# Patient Record
Sex: Male | Born: 2000 | Marital: Single
Health system: Southern US, Community
[De-identification: ages and names within clinical notes are randomized; demographics above are authoritative.]

---
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
## 2021-02-05 ENCOUNTER — Emergency Department
Admission: EM | Admit: 2021-02-05 | Discharge: 2021-02-05 | Disposition: A | Discharge status: Home / Self Care | Payer: PRIVATE HEALTH INSURANCE | Attending: Emergency Medicine | Admitting: Emergency Medicine

## 2021-02-05 ENCOUNTER — Other Ambulatory Visit: Payer: Self-pay

## 2021-02-05 ENCOUNTER — Encounter: Payer: Self-pay | Admitting: Emergency Medicine

## 2021-02-05 ENCOUNTER — Emergency Department: Payer: PRIVATE HEALTH INSURANCE

## 2021-02-05 DIAGNOSIS — W260XXA Contact with knife, initial encounter: Secondary | ICD-10-CM | POA: Diagnosis not present

## 2021-02-05 DIAGNOSIS — S6992XA Unspecified injury of left wrist, hand and finger(s), initial encounter: Secondary | ICD-10-CM | POA: Diagnosis present

## 2021-02-05 DIAGNOSIS — S61412A Laceration without foreign body of left hand, initial encounter: Secondary | ICD-10-CM

## 2021-02-05 DIAGNOSIS — S61012A Laceration without foreign body of left thumb without damage to nail, initial encounter: Secondary | ICD-10-CM | POA: Insufficient documentation

## 2021-02-05 DIAGNOSIS — Y990 Civilian activity done for income or pay: Secondary | ICD-10-CM | POA: Diagnosis not present

## 2021-02-05 MED ORDER — LIDOCAINE HCL (PF) 1 % IJ SOLN
5.0000 mL | Freq: Once | INTRAMUSCULAR | Status: AC
  Administered 2021-02-05: 5 mL

## 2021-02-05 MED ORDER — BACITRACIN ZINC 500 UNIT/GM EX OINT: TOPICAL_OINTMENT | Freq: Two times a day (BID) | CUTANEOUS | Status: DC

## 2021-02-05 MED ORDER — LIDOCAINE HCL (PF) 1 % IJ SOLN
INTRAMUSCULAR | Status: AC
  Administered 2021-02-05: 5 mL
  Filled 2021-02-05: qty 5

## 2021-02-05 NOTE — ED Provider Notes (Signed)
[facility] Emergency Department Provider Note   ____________________________________________   I have reviewed the triage vital signs and the nursing notes.   HISTORY  Chief Complaint Laceration   History limited by: Not Limited   HPI Marcus Ramsey is a 20 y.o. male who presents to the emergency department today because of concern for laceration to his left thumb. He states he was at work when it happened. He was cutting something with a knife when it slipped and he accidentily cut his thumb. He denies any numbness to his thumb, denies any decreased rom. States last tetanus shot was roughly 2 years ago.   Records reviewed. No pertinent past medical history.  History reviewed. No pertinent surgical history.  Prior to Admission medications   Not on File    Allergies Patient has no known allergies.  No family history on file.  Social History  Employed Former [employer]  Review of Systems Constitutional: No fever/chills Eyes: No visual changes. ENT: No sore throat. Cardiovascular: Denies chest pain. Respiratory: Denies shortness of breath. Gastrointestinal: No abdominal pain.  No nausea, no vomiting.  No diarrhea.   Genitourinary: Negative for dysuria. Musculoskeletal: Negative for back pain. Skin: Laceration to left hand Neurological: Negative for headaches, focal weakness or numbness.  ____________________________________________   PHYSICAL EXAM:  VITAL SIGNS: ED Triage Vitals  Enc Vitals Group     BP 02/05/21 0139 (!) 136/97     Pulse Rate 02/05/21 0139 68     Resp 02/05/21 0139 18     Temp 02/05/21 0139 97.9 F (36.6 C)     Temp Source 02/05/21 0139 Oral     SpO2 02/05/21 0139 98 %     Weight 02/05/21 0133 184 lb (83.5 kg)     Height 02/05/21 0133 5\' 9"  (1.753 m)     Head Circumference --      Peak Flow --      Pain Score 02/05/21 0133 0   Constitutional: Alert and oriented.  Eyes: Conjunctivae are normal.  ENT      Head:  Normocephalic and atraumatic.      Nose: No congestion/rhinnorhea.      Mouth/Throat: Mucous membranes are moist.      Neck: No stridor. Hematological/Lymphatic/Immunilogical: No cervical lymphadenopathy. Cardiovascular: Normal rate, regular rhythm.  No murmurs, rubs, or gallops.  Respiratory: Normal respiratory effort without tachypnea nor retractions. Breath sounds are clear and equal bilaterally. No wheezes/rales/rhonchi. Gastrointestinal: Soft and non tender. No rebound. No guarding.  Genitourinary: Deferred Musculoskeletal: Normal range of motion in all extremities. No lower extremity edema. Left thumb with full ROM and sensation is intact. Neurologic:  Normal speech and language. No gross focal neurologic deficits are appreciated.  Skin:  2.5 cm linear laceration to base of left thumb. Psychiatric: Mood and affect are normal. Speech and behavior are normal. Patient exhibits appropriate insight and judgment.  ____________________________________________    LABS (pertinent positives/negatives)  None  ____________________________________________   EKG  None  ____________________________________________    RADIOLOGY  Left thumb No osseous abnormality. No FB  ____________________________________________   PROCEDURES  Procedures  LACERATION REPAIR Performed by: 02/07/21 Authorized by: Phineas Semen Consent: Verbal consent obtained. Risks and benefits: risks, benefits and alternatives were discussed Consent given by: patient Patient identity confirmed: provided demographic data Prepped and Draped in normal sterile fashion Wound explored  Laceration Location: left thumb  Laceration Length: 2.5 cm  No Foreign Bodies seen or palpated  Anesthesia: local infiltration  Local anesthetic: lidocaine 1% without  epinephrine  Anesthetic total: 1 ml  Irrigation method: syringe Amount of cleaning: standard  Skin closure: 5-0 vicryl rapide  Number of  sutures: 7   Technique: simple interrupted  Patient tolerance: Patient tolerated the procedure well with no immediate complications.  ____________________________________________   INITIAL IMPRESSION / ASSESSMENT AND PLAN / ED COURSE  Pertinent labs & imaging results that were available during my care of the patient were reviewed by me and considered in my medical decision making (see chart for details).   Patient presented to the emergency department today because of concern for laceration to base of left thumb. X-ray without FB or osseous abnormality. Tetanus up to date. Laceration was repaired. Discussed laceration care with patient.    ____________________________________________   FINAL CLINICAL IMPRESSION(S) / ED DIAGNOSES  Final diagnoses:  Laceration of left hand without foreign body, initial encounter     Note: This dictation was prepared with Dragon dictation. Any transcriptional errors that result from this process are unintentional     Phineas Semen, MD 02/05/21 [phone]

## 2021-02-05 NOTE — ED Triage Notes (Signed)
Patient ambulatory to triage with steady gait, without difficulty or distress noted; pt reports cutting left thumb with knife while at work [employer]; denies desire to file workers comp at this time; approx 2" lac noted with small amount bleeding; gauze dressing applied

## 2021-02-05 NOTE — Discharge Instructions (Signed)
Please seek medical attention for any high fevers, chest pain, shortness of breath, change in behavior, persistent vomiting, bloody stool or any other new or concerning symptoms.  

## 2022-02-23 IMAGING — DX DG FINGER THUMB 2+V*L*
3 series · 3 of 3 positions shown · non-contrast
Comparison: None.

CLINICAL DATA: Laceration.  Cut thumb with night while at work.

EXAM:
LEFT THUMB 2+V

[finger ap]
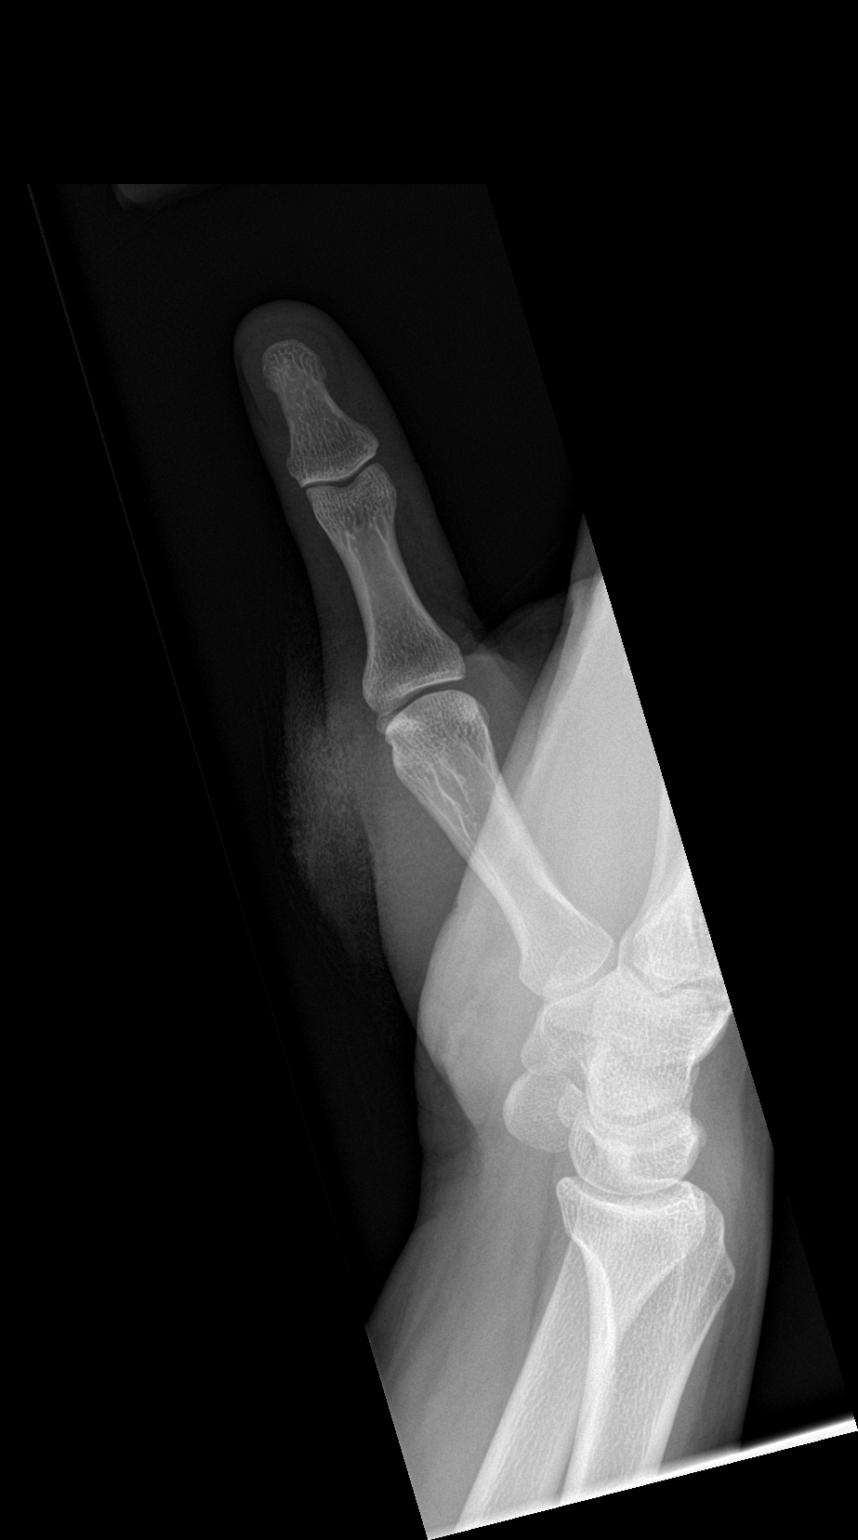

[finger obl]
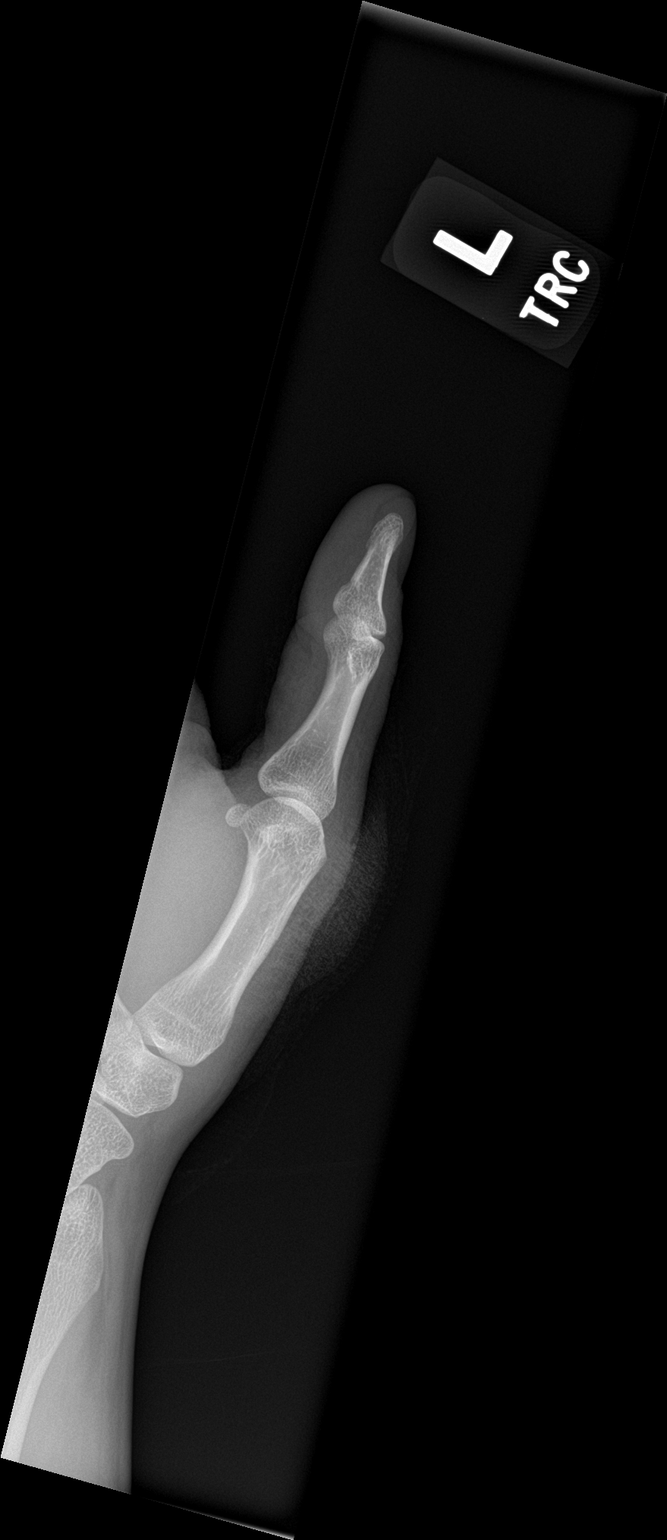

[finger lat]
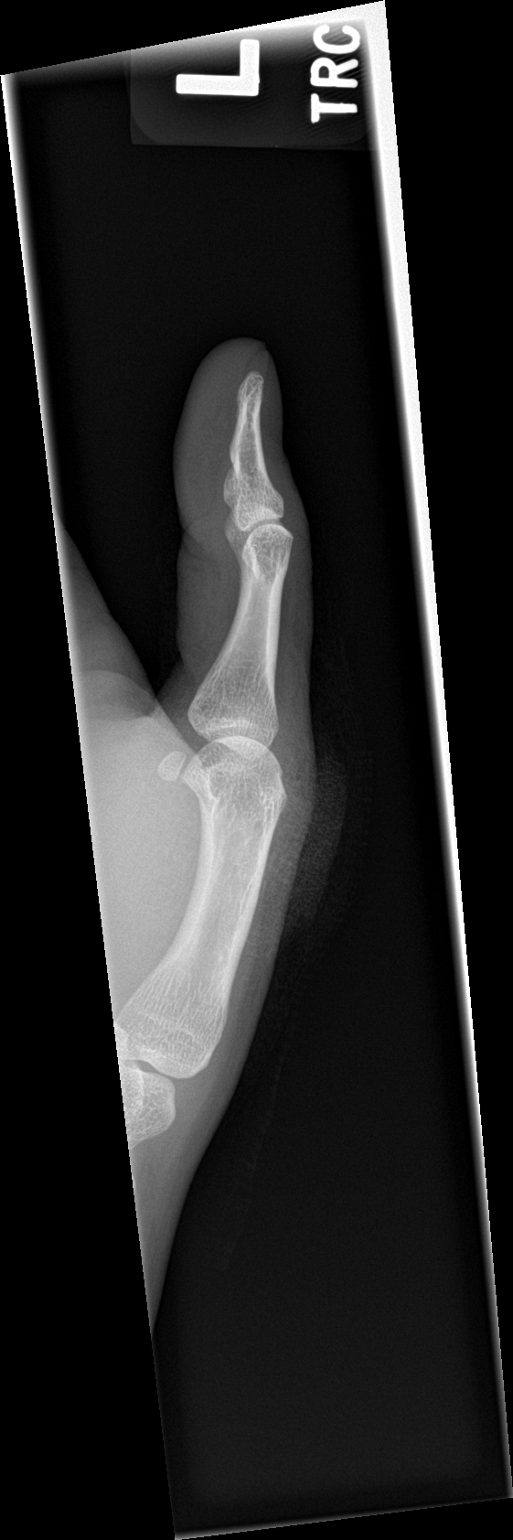

[3 of 3 positions shown; findings below may reference images not displayed]

FINDINGS: There is no evidence of fracture or dislocation. There is no
evidence of arthropathy or other focal bone abnormality. Soft tissue
laceration is identified adjacent to the first MCP joint. No
radiopaque foreign bodies.
IMPRESSION: 1. No acute bone abnormality.
2. Soft tissue laceration. No radiopaque foreign bodies.
